# Patient Record
Sex: Male | Born: 1975 | Race: White | Hispanic: No | Marital: Married | Smoking: Current every day smoker
Health system: Southern US, Community
[De-identification: ages and names within clinical notes are randomized; demographics above are authoritative.]

## PROBLEM LIST (undated history)

## (undated) DIAGNOSIS — I10 Essential (primary) hypertension: Secondary | ICD-10-CM

---

## 2015-03-12 ENCOUNTER — Emergency Department (HOSPITAL_COMMUNITY): Payer: Medicaid Other

## 2015-03-12 ENCOUNTER — Emergency Department (HOSPITAL_COMMUNITY)
Admission: EM | Admit: 2015-03-12 | Discharge: 2015-03-13 | Disposition: A | Discharge status: Home / Self Care | Payer: Medicaid Other | Attending: Emergency Medicine | Admitting: Emergency Medicine

## 2015-03-12 ENCOUNTER — Encounter (HOSPITAL_COMMUNITY): Payer: Self-pay | Admitting: *Deleted

## 2015-03-12 DIAGNOSIS — S0081XA Abrasion of other part of head, initial encounter: Secondary | ICD-10-CM | POA: Insufficient documentation

## 2015-03-12 DIAGNOSIS — Y999 Unspecified external cause status: Secondary | ICD-10-CM | POA: Insufficient documentation

## 2015-03-12 DIAGNOSIS — Y92410 Unspecified street and highway as the place of occurrence of the external cause: Secondary | ICD-10-CM | POA: Diagnosis not present

## 2015-03-12 DIAGNOSIS — S42032A Displaced fracture of lateral end of left clavicle, initial encounter for closed fracture: Secondary | ICD-10-CM | POA: Diagnosis not present

## 2015-03-12 DIAGNOSIS — S2232XA Fracture of one rib, left side, initial encounter for closed fracture: Secondary | ICD-10-CM | POA: Insufficient documentation

## 2015-03-12 DIAGNOSIS — F17200 Nicotine dependence, unspecified, uncomplicated: Secondary | ICD-10-CM | POA: Diagnosis not present

## 2015-03-12 DIAGNOSIS — I10 Essential (primary) hypertension: Secondary | ICD-10-CM | POA: Diagnosis not present

## 2015-03-12 DIAGNOSIS — Y9389 Activity, other specified: Secondary | ICD-10-CM | POA: Insufficient documentation

## 2015-03-12 DIAGNOSIS — T1490XA Injury, unspecified, initial encounter: Secondary | ICD-10-CM

## 2015-03-12 DIAGNOSIS — S299XXA Unspecified injury of thorax, initial encounter: Secondary | ICD-10-CM | POA: Diagnosis present

## 2015-03-12 DIAGNOSIS — R52 Pain, unspecified: Secondary | ICD-10-CM

## 2015-03-12 DIAGNOSIS — S42002A Fracture of unspecified part of left clavicle, initial encounter for closed fracture: Secondary | ICD-10-CM

## 2015-03-12 HISTORY — DX: Essential (primary) hypertension: I10

## 2015-03-12 LAB — CBC
HCT: 38.2 % — ABNORMAL LOW (ref 39.0–52.0)
HEMOGLOBIN: 12.9 g/dL — AB (ref 13.0–17.0)
MCH: 28.6 pg (ref 26.0–34.0)
MCHC: 33.8 g/dL (ref 30.0–36.0)
MCV: 84.7 fL (ref 78.0–100.0)
Platelets: 220 10*3/uL (ref 150–400)
RBC: 4.51 MIL/uL (ref 4.22–5.81)
RDW: 12.1 % (ref 11.5–15.5)
WBC: 12.8 10*3/uL — ABNORMAL HIGH (ref 4.0–10.5)

## 2015-03-12 LAB — COMPREHENSIVE METABOLIC PANEL
ALBUMIN: 3.2 g/dL — AB (ref 3.5–5.0)
ALK PHOS: 76 U/L (ref 38–126)
ALT: 42 U/L (ref 17–63)
ANION GAP: 3 — AB (ref 5–15)
AST: 34 U/L (ref 15–41)
BUN: 8 mg/dL (ref 6–20)
CALCIUM: 8.6 mg/dL — AB (ref 8.9–10.3)
CHLORIDE: 106 mmol/L (ref 101–111)
CO2: 31 mmol/L (ref 22–32)
Creatinine, Ser: 1.04 mg/dL (ref 0.61–1.24)
GFR calc non Af Amer: >60 mL/min (ref >60)
GLUCOSE: 85 mg/dL (ref 65–99)
Potassium: 3.9 mmol/L (ref 3.5–5.1)
SODIUM: 140 mmol/L (ref 135–145)
Total Bilirubin: 0.6 mg/dL (ref 0.3–1.2)
Total Protein: 6 g/dL — ABNORMAL LOW (ref 6.5–8.1)

## 2015-03-12 LAB — CDS SEROLOGY

## 2015-03-12 LAB — PROTIME-INR
INR: 0.94 (ref 0.00–1.49)
Prothrombin Time: 12.8 seconds (ref 11.6–15.2)

## 2015-03-12 LAB — ETHANOL

## 2015-03-12 MED ORDER — HYDROCODONE-ACETAMINOPHEN 5-325 MG PO TABS: 1.0000 | ORAL_TABLET | Freq: Four times a day (QID) | ORAL | Status: AC | PRN

## 2015-03-12 MED ORDER — OXYCODONE-ACETAMINOPHEN 5-325 MG PO TABS
1.0000 | ORAL_TABLET | Freq: Once | ORAL | Status: AC
  Administered 2015-03-12: 1 via ORAL
  Filled 2015-03-12: qty 1

## 2015-03-12 MED ORDER — FENTANYL CITRATE (PF) 100 MCG/2ML IJ SOLN
100.0000 ug | Freq: Once | INTRAMUSCULAR | Status: AC
  Administered 2015-03-12: 100 ug via INTRAVENOUS
  Filled 2015-03-12: qty 2

## 2015-03-12 NOTE — Discharge Instructions (Signed)
Clavicle Fracture  The clavicle, also called the collarbone, is the long bone that connects your shoulder to your rib cage. You can feel your collarbone at the top of your shoulders and rib cage. A clavicle fracture is a broken clavicle. It is a common injury that can happen at any age.   CAUSES  Common causes of a clavicle fracture include:  · A direct blow to your shoulder.  · A car accident.  · A fall, especially if you try to break your fall with an outstretched arm.  RISK FACTORS  You may be at increased risk if:  · You are younger than 25 years or older than 75 years. Most clavicle fractures happen to people who are younger than 25 years.  · You are a male.  · You play contact sports.  SIGNS AND SYMPTOMS  A fractured clavicle is painful. It also makes it hard to move your arm. Other signs and symptoms may include:  · A shoulder that drops downward and forward.  · Pain when trying to lift your shoulder.  · Bruising, swelling, and tenderness over your clavicle.  · A grinding noise when you try to move your shoulder.  · A bump over your clavicle.  DIAGNOSIS  Your health care provider can usually diagnose a clavicle fracture by asking about your injury and examining your shoulder and clavicle. He or she may take an X-ray to determine the position of your clavicle.  TREATMENT  Treatment depends on the position of your clavicle after the fracture:  · If the broken ends of the bone are not out of place, your health care provider may put your arm in a sling or wrap a support bandage around your chest (figure-of-eight wrap).  · If the broken ends of the bone are out of place, you may need surgery. Surgery may involve placing screws, pins, or plates to keep your clavicle stable while it heals. Healing may take about 3 months.  When your health care provider thinks your fracture has healed enough, you may have to do physical therapy to regain normal movement and build up your arm strength.  HOME CARE INSTRUCTIONS    · Apply ice to the injured area:    Put ice in a plastic bag.    Place a towel between your skin and the bag.    Leave the ice on for 20 minutes, 2-3 times a day.  · If you have a wrap or splint:    Wear it all the time, and remove it only to take a bath or shower.    When you bathe or shower, keep your shoulder in the same position as when the sling or wrap is on.    Do not lift your arm.  · If you have a figure-of-eight wrap:    Another person must tighten it every day.    It should be tight enough to hold your shoulders back.    Allow enough room to place your index finger between your body and the strap.    Loosen the wrap immediately if you feel numbness or tingling in your hands.  · Only take medicines as directed by your health care provider.  · Avoid activities that make the injury or pain worse for 4-6 weeks after surgery.  · Keep all follow-up appointments.  SEEK MEDICAL CARE IF:   Your medicine is not helping to relieve pain and swelling.  SEEK IMMEDIATE MEDICAL CARE IF:   Your arm is   numb, cold, or pale, even when the splint is loose.  MAKE SURE YOU:   · Understand these instructions.  · Will watch your condition.  · Will get help right away if you are not doing well or get worse.     This information is not intended to replace advice given to you by your health care provider. Make sure you discuss any questions you have with your health care provider.     Document Released: 01/03/2005 Document Revised: 03/31/2013 Document Reviewed: 02/16/2013  Elsevier Interactive Patient Education ©2016 [employer]
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
## 2015-03-12 NOTE — ED Notes (Signed)
Pt has been advised that his mother is sending someone to get him.  Sleeping otherwise
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
## 2015-03-12 NOTE — ED Notes (Signed)
The pts mother is at the bedside

## 2015-03-12 NOTE — ED Notes (Signed)
The pt is still sleeping.  His family has not shown up yet

## 2015-03-12 NOTE — ED Notes (Signed)
The pt is very sleepy  And the phone number he was giving me is not a working number.  He cannot give me another number

## 2015-03-12 NOTE — ED Notes (Signed)
Pain med giuven
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
## 2015-03-13 LAB — SAMPLE TO BLOOD BANK

## 2015-03-13 NOTE — ED Notes (Signed)
Pain med given 
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
